# Patient Record
Sex: Male | Born: 1960 | Race: White | Hispanic: No | Marital: Married
Health system: Southern US, Community
[De-identification: ages and names within clinical notes are randomized; demographics above are authoritative.]

---

## 2003-01-04 ENCOUNTER — Encounter: Payer: Self-pay | Admitting: Internal Medicine

## 2003-01-04 ENCOUNTER — Encounter: Admission: RE | Admit: 2003-01-04 | Discharge: 2003-01-04 | Payer: Self-pay | Admitting: Internal Medicine

## 2005-11-03 ENCOUNTER — Other Ambulatory Visit: Admission: RE | Admit: 2005-11-03 | Discharge: 2005-11-03 | Payer: Self-pay | Admitting: Family Medicine

## 2005-12-06 ENCOUNTER — Ambulatory Visit (HOSPITAL_COMMUNITY): Admission: RE | Admit: 2005-12-06 | Discharge: 2005-12-07 | Payer: Self-pay | Admitting: Surgery

## 2011-10-05 ENCOUNTER — Other Ambulatory Visit: Payer: Self-pay | Admitting: Dermatology

## 2017-10-03 ENCOUNTER — Other Ambulatory Visit: Payer: Self-pay | Admitting: Physician Assistant

## 2017-10-03 DIAGNOSIS — R0989 Other specified symptoms and signs involving the circulatory and respiratory systems: Secondary | ICD-10-CM

## 2017-10-05 ENCOUNTER — Ambulatory Visit
Admission: RE | Admit: 2017-10-05 | Discharge: 2017-10-05 | Disposition: A | Discharge status: Home / Self Care | Payer: BLUE CROSS/BLUE SHIELD | Source: Ambulatory Visit | Attending: Physician Assistant | Admitting: Physician Assistant

## 2017-10-05 DIAGNOSIS — R0989 Other specified symptoms and signs involving the circulatory and respiratory systems: Secondary | ICD-10-CM

## 2018-03-22 DIAGNOSIS — M47896 Other spondylosis, lumbar region: Secondary | ICD-10-CM | POA: Diagnosis not present

## 2018-03-22 DIAGNOSIS — M160 Bilateral primary osteoarthritis of hip: Secondary | ICD-10-CM | POA: Diagnosis not present

## 2018-03-22 DIAGNOSIS — M5136 Other intervertebral disc degeneration, lumbar region: Secondary | ICD-10-CM | POA: Diagnosis not present

## 2018-03-22 DIAGNOSIS — I10 Essential (primary) hypertension: Secondary | ICD-10-CM | POA: Diagnosis not present

## 2018-03-23 DIAGNOSIS — E039 Hypothyroidism, unspecified: Secondary | ICD-10-CM | POA: Diagnosis not present

## 2018-03-23 DIAGNOSIS — I10 Essential (primary) hypertension: Secondary | ICD-10-CM | POA: Diagnosis not present

## 2018-03-23 DIAGNOSIS — E119 Type 2 diabetes mellitus without complications: Secondary | ICD-10-CM | POA: Diagnosis not present

## 2018-03-23 DIAGNOSIS — Z125 Encounter for screening for malignant neoplasm of prostate: Secondary | ICD-10-CM | POA: Diagnosis not present

## 2018-03-30 DIAGNOSIS — I10 Essential (primary) hypertension: Secondary | ICD-10-CM | POA: Diagnosis not present

## 2018-03-30 DIAGNOSIS — Z23 Encounter for immunization: Secondary | ICD-10-CM | POA: Diagnosis not present

## 2018-03-30 DIAGNOSIS — E119 Type 2 diabetes mellitus without complications: Secondary | ICD-10-CM | POA: Diagnosis not present

## 2018-03-30 DIAGNOSIS — E039 Hypothyroidism, unspecified: Secondary | ICD-10-CM | POA: Diagnosis not present

## 2018-04-05 DIAGNOSIS — F411 Generalized anxiety disorder: Secondary | ICD-10-CM | POA: Diagnosis not present

## 2018-04-27 DIAGNOSIS — M5416 Radiculopathy, lumbar region: Secondary | ICD-10-CM | POA: Diagnosis not present

## 2018-04-27 DIAGNOSIS — M545 Low back pain: Secondary | ICD-10-CM | POA: Diagnosis not present

## 2018-05-31 DIAGNOSIS — I10 Essential (primary) hypertension: Secondary | ICD-10-CM | POA: Diagnosis not present

## 2018-05-31 DIAGNOSIS — H6123 Impacted cerumen, bilateral: Secondary | ICD-10-CM | POA: Diagnosis not present

## 2018-05-31 DIAGNOSIS — E7800 Pure hypercholesterolemia, unspecified: Secondary | ICD-10-CM | POA: Diagnosis not present

## 2018-05-31 DIAGNOSIS — R7301 Impaired fasting glucose: Secondary | ICD-10-CM | POA: Diagnosis not present

## 2018-05-31 DIAGNOSIS — F329 Major depressive disorder, single episode, unspecified: Secondary | ICD-10-CM | POA: Diagnosis not present

## 2018-06-30 DIAGNOSIS — F4329 Adjustment disorder with other symptoms: Secondary | ICD-10-CM | POA: Diagnosis not present

## 2018-06-30 DIAGNOSIS — G479 Sleep disorder, unspecified: Secondary | ICD-10-CM | POA: Diagnosis not present

## 2018-06-30 DIAGNOSIS — F411 Generalized anxiety disorder: Secondary | ICD-10-CM | POA: Diagnosis not present

## 2018-07-06 DIAGNOSIS — M5136 Other intervertebral disc degeneration, lumbar region: Secondary | ICD-10-CM | POA: Diagnosis not present

## 2018-07-06 DIAGNOSIS — M47896 Other spondylosis, lumbar region: Secondary | ICD-10-CM | POA: Diagnosis not present

## 2018-07-06 DIAGNOSIS — M48061 Spinal stenosis, lumbar region without neurogenic claudication: Secondary | ICD-10-CM | POA: Diagnosis not present

## 2018-07-06 DIAGNOSIS — M5416 Radiculopathy, lumbar region: Secondary | ICD-10-CM | POA: Diagnosis not present

## 2018-08-07 DIAGNOSIS — M5416 Radiculopathy, lumbar region: Secondary | ICD-10-CM | POA: Diagnosis not present

## 2018-08-07 DIAGNOSIS — M47816 Spondylosis without myelopathy or radiculopathy, lumbar region: Secondary | ICD-10-CM | POA: Diagnosis not present

## 2018-08-07 DIAGNOSIS — M5137 Other intervertebral disc degeneration, lumbosacral region: Secondary | ICD-10-CM | POA: Diagnosis not present

## 2018-08-07 DIAGNOSIS — M5136 Other intervertebral disc degeneration, lumbar region: Secondary | ICD-10-CM | POA: Diagnosis not present

## 2018-09-25 DIAGNOSIS — F411 Generalized anxiety disorder: Secondary | ICD-10-CM | POA: Diagnosis not present

## 2018-09-25 DIAGNOSIS — F4329 Adjustment disorder with other symptoms: Secondary | ICD-10-CM | POA: Diagnosis not present

## 2018-10-18 DIAGNOSIS — R5383 Other fatigue: Secondary | ICD-10-CM | POA: Diagnosis not present

## 2018-10-18 DIAGNOSIS — E118 Type 2 diabetes mellitus with unspecified complications: Secondary | ICD-10-CM | POA: Diagnosis not present

## 2018-10-18 DIAGNOSIS — Z13220 Encounter for screening for lipoid disorders: Secondary | ICD-10-CM | POA: Diagnosis not present

## 2018-10-18 DIAGNOSIS — E039 Hypothyroidism, unspecified: Secondary | ICD-10-CM | POA: Diagnosis not present

## 2018-10-18 DIAGNOSIS — I10 Essential (primary) hypertension: Secondary | ICD-10-CM | POA: Diagnosis not present

## 2018-10-23 DIAGNOSIS — E119 Type 2 diabetes mellitus without complications: Secondary | ICD-10-CM | POA: Diagnosis not present

## 2018-10-23 DIAGNOSIS — I10 Essential (primary) hypertension: Secondary | ICD-10-CM | POA: Diagnosis not present

## 2018-10-23 DIAGNOSIS — E039 Hypothyroidism, unspecified: Secondary | ICD-10-CM | POA: Diagnosis not present

## 2018-10-23 DIAGNOSIS — Z23 Encounter for immunization: Secondary | ICD-10-CM | POA: Diagnosis not present

## 2018-11-06 DIAGNOSIS — M1612 Unilateral primary osteoarthritis, left hip: Secondary | ICD-10-CM | POA: Diagnosis not present

## 2018-11-06 DIAGNOSIS — M5136 Other intervertebral disc degeneration, lumbar region: Secondary | ICD-10-CM | POA: Diagnosis not present

## 2018-11-06 DIAGNOSIS — M5416 Radiculopathy, lumbar region: Secondary | ICD-10-CM | POA: Diagnosis not present

## 2018-11-06 DIAGNOSIS — M25552 Pain in left hip: Secondary | ICD-10-CM | POA: Diagnosis not present

## 2018-12-21 DIAGNOSIS — F4329 Adjustment disorder with other symptoms: Secondary | ICD-10-CM | POA: Diagnosis not present

## 2018-12-21 DIAGNOSIS — F411 Generalized anxiety disorder: Secondary | ICD-10-CM | POA: Diagnosis not present

## 2019-01-03 DIAGNOSIS — M5137 Other intervertebral disc degeneration, lumbosacral region: Secondary | ICD-10-CM | POA: Diagnosis not present

## 2019-01-03 DIAGNOSIS — M47896 Other spondylosis, lumbar region: Secondary | ICD-10-CM | POA: Diagnosis not present

## 2019-01-03 DIAGNOSIS — M5416 Radiculopathy, lumbar region: Secondary | ICD-10-CM | POA: Diagnosis not present

## 2019-01-03 DIAGNOSIS — E119 Type 2 diabetes mellitus without complications: Secondary | ICD-10-CM | POA: Diagnosis not present

## 2019-03-21 DIAGNOSIS — F4329 Adjustment disorder with other symptoms: Secondary | ICD-10-CM | POA: Diagnosis not present

## 2019-03-21 DIAGNOSIS — F411 Generalized anxiety disorder: Secondary | ICD-10-CM | POA: Diagnosis not present

## 2019-04-05 DIAGNOSIS — Z79891 Long term (current) use of opiate analgesic: Secondary | ICD-10-CM | POA: Diagnosis not present

## 2019-04-05 DIAGNOSIS — M4726 Other spondylosis with radiculopathy, lumbar region: Secondary | ICD-10-CM | POA: Diagnosis not present

## 2019-04-05 DIAGNOSIS — M5116 Intervertebral disc disorders with radiculopathy, lumbar region: Secondary | ICD-10-CM | POA: Diagnosis not present

## 2019-04-05 DIAGNOSIS — M48061 Spinal stenosis, lumbar region without neurogenic claudication: Secondary | ICD-10-CM | POA: Diagnosis not present

## 2019-05-02 DIAGNOSIS — M5116 Intervertebral disc disorders with radiculopathy, lumbar region: Secondary | ICD-10-CM | POA: Diagnosis not present

## 2019-05-02 DIAGNOSIS — M4726 Other spondylosis with radiculopathy, lumbar region: Secondary | ICD-10-CM | POA: Diagnosis not present

## 2019-05-02 DIAGNOSIS — M48061 Spinal stenosis, lumbar region without neurogenic claudication: Secondary | ICD-10-CM | POA: Diagnosis not present

## 2019-05-02 DIAGNOSIS — I10 Essential (primary) hypertension: Secondary | ICD-10-CM | POA: Diagnosis not present

## 2019-05-10 DIAGNOSIS — R7303 Prediabetes: Secondary | ICD-10-CM | POA: Diagnosis not present

## 2019-05-10 DIAGNOSIS — E039 Hypothyroidism, unspecified: Secondary | ICD-10-CM | POA: Diagnosis not present

## 2019-06-14 DIAGNOSIS — I10 Essential (primary) hypertension: Secondary | ICD-10-CM | POA: Diagnosis not present

## 2019-06-14 DIAGNOSIS — N289 Disorder of kidney and ureter, unspecified: Secondary | ICD-10-CM | POA: Diagnosis not present

## 2019-06-14 DIAGNOSIS — Z23 Encounter for immunization: Secondary | ICD-10-CM | POA: Diagnosis not present

## 2019-06-14 DIAGNOSIS — E119 Type 2 diabetes mellitus without complications: Secondary | ICD-10-CM | POA: Diagnosis not present

## 2019-06-14 DIAGNOSIS — E039 Hypothyroidism, unspecified: Secondary | ICD-10-CM | POA: Diagnosis not present

## 2019-06-18 DIAGNOSIS — F411 Generalized anxiety disorder: Secondary | ICD-10-CM | POA: Diagnosis not present

## 2019-06-18 DIAGNOSIS — F4329 Adjustment disorder with other symptoms: Secondary | ICD-10-CM | POA: Diagnosis not present

## 2019-08-09 DIAGNOSIS — M5136 Other intervertebral disc degeneration, lumbar region: Secondary | ICD-10-CM | POA: Diagnosis not present

## 2019-08-09 DIAGNOSIS — M5416 Radiculopathy, lumbar region: Secondary | ICD-10-CM | POA: Diagnosis not present

## 2019-08-09 DIAGNOSIS — M5126 Other intervertebral disc displacement, lumbar region: Secondary | ICD-10-CM | POA: Diagnosis not present

## 2019-08-13 DIAGNOSIS — F329 Major depressive disorder, single episode, unspecified: Secondary | ICD-10-CM | POA: Diagnosis not present

## 2019-08-13 DIAGNOSIS — J309 Allergic rhinitis, unspecified: Secondary | ICD-10-CM | POA: Diagnosis not present

## 2019-08-13 DIAGNOSIS — I10 Essential (primary) hypertension: Secondary | ICD-10-CM | POA: Diagnosis not present

## 2019-08-13 DIAGNOSIS — E7800 Pure hypercholesterolemia, unspecified: Secondary | ICD-10-CM | POA: Diagnosis not present

## 2019-08-15 DIAGNOSIS — M545 Low back pain: Secondary | ICD-10-CM | POA: Diagnosis not present

## 2019-08-15 DIAGNOSIS — M5136 Other intervertebral disc degeneration, lumbar region: Secondary | ICD-10-CM | POA: Diagnosis not present

## 2019-08-15 DIAGNOSIS — M5416 Radiculopathy, lumbar region: Secondary | ICD-10-CM | POA: Diagnosis not present

## 2019-08-20 DIAGNOSIS — F4329 Adjustment disorder with other symptoms: Secondary | ICD-10-CM | POA: Diagnosis not present

## 2019-08-20 DIAGNOSIS — G479 Sleep disorder, unspecified: Secondary | ICD-10-CM | POA: Diagnosis not present

## 2019-08-20 DIAGNOSIS — F411 Generalized anxiety disorder: Secondary | ICD-10-CM | POA: Diagnosis not present

## 2019-09-11 DIAGNOSIS — F419 Anxiety disorder, unspecified: Secondary | ICD-10-CM | POA: Diagnosis not present

## 2019-09-11 DIAGNOSIS — F4329 Adjustment disorder with other symptoms: Secondary | ICD-10-CM | POA: Diagnosis not present

## 2019-09-13 DIAGNOSIS — M1612 Unilateral primary osteoarthritis, left hip: Secondary | ICD-10-CM | POA: Diagnosis not present

## 2019-09-13 DIAGNOSIS — M5126 Other intervertebral disc displacement, lumbar region: Secondary | ICD-10-CM | POA: Diagnosis not present

## 2019-09-13 DIAGNOSIS — M5137 Other intervertebral disc degeneration, lumbosacral region: Secondary | ICD-10-CM | POA: Diagnosis not present

## 2019-10-04 DIAGNOSIS — M1612 Unilateral primary osteoarthritis, left hip: Secondary | ICD-10-CM | POA: Diagnosis not present

## 2019-10-06 DIAGNOSIS — M1612 Unilateral primary osteoarthritis, left hip: Secondary | ICD-10-CM | POA: Diagnosis not present

## 2019-10-09 DIAGNOSIS — Z20828 Contact with and (suspected) exposure to other viral communicable diseases: Secondary | ICD-10-CM | POA: Diagnosis not present

## 2019-10-15 DIAGNOSIS — M47816 Spondylosis without myelopathy or radiculopathy, lumbar region: Secondary | ICD-10-CM | POA: Diagnosis not present

## 2019-10-15 DIAGNOSIS — M5136 Other intervertebral disc degeneration, lumbar region: Secondary | ICD-10-CM | POA: Diagnosis not present

## 2019-10-15 DIAGNOSIS — M1612 Unilateral primary osteoarthritis, left hip: Secondary | ICD-10-CM | POA: Diagnosis not present

## 2019-10-15 DIAGNOSIS — M25552 Pain in left hip: Secondary | ICD-10-CM | POA: Diagnosis not present

## 2019-10-16 DIAGNOSIS — Z79899 Other long term (current) drug therapy: Secondary | ICD-10-CM | POA: Diagnosis not present

## 2019-10-16 DIAGNOSIS — F419 Anxiety disorder, unspecified: Secondary | ICD-10-CM | POA: Diagnosis not present

## 2019-10-16 DIAGNOSIS — E7800 Pure hypercholesterolemia, unspecified: Secondary | ICD-10-CM | POA: Diagnosis not present

## 2019-10-16 DIAGNOSIS — F4329 Adjustment disorder with other symptoms: Secondary | ICD-10-CM | POA: Diagnosis not present

## 2019-10-16 DIAGNOSIS — Z0000 Encounter for general adult medical examination without abnormal findings: Secondary | ICD-10-CM | POA: Diagnosis not present

## 2019-10-19 DIAGNOSIS — Z23 Encounter for immunization: Secondary | ICD-10-CM | POA: Diagnosis not present

## 2019-10-19 DIAGNOSIS — H6123 Impacted cerumen, bilateral: Secondary | ICD-10-CM | POA: Diagnosis not present

## 2019-10-24 DIAGNOSIS — Z20828 Contact with and (suspected) exposure to other viral communicable diseases: Secondary | ICD-10-CM | POA: Diagnosis not present

## 2019-11-16 DIAGNOSIS — T300 Burn of unspecified body region, unspecified degree: Secondary | ICD-10-CM | POA: Diagnosis not present

## 2019-11-16 DIAGNOSIS — E119 Type 2 diabetes mellitus without complications: Secondary | ICD-10-CM | POA: Diagnosis not present

## 2019-11-16 DIAGNOSIS — I10 Essential (primary) hypertension: Secondary | ICD-10-CM | POA: Diagnosis not present

## 2019-11-16 DIAGNOSIS — Z125 Encounter for screening for malignant neoplasm of prostate: Secondary | ICD-10-CM | POA: Diagnosis not present

## 2019-11-21 DIAGNOSIS — M1612 Unilateral primary osteoarthritis, left hip: Secondary | ICD-10-CM | POA: Diagnosis not present

## 2019-11-21 DIAGNOSIS — M47816 Spondylosis without myelopathy or radiculopathy, lumbar region: Secondary | ICD-10-CM | POA: Diagnosis not present

## 2019-11-21 DIAGNOSIS — M5136 Other intervertebral disc degeneration, lumbar region: Secondary | ICD-10-CM | POA: Diagnosis not present

## 2019-11-21 DIAGNOSIS — E119 Type 2 diabetes mellitus without complications: Secondary | ICD-10-CM | POA: Diagnosis not present

## 2019-11-28 DIAGNOSIS — F411 Generalized anxiety disorder: Secondary | ICD-10-CM | POA: Diagnosis not present

## 2019-11-28 DIAGNOSIS — F4329 Adjustment disorder with other symptoms: Secondary | ICD-10-CM | POA: Diagnosis not present

## 2019-12-24 DIAGNOSIS — I10 Essential (primary) hypertension: Secondary | ICD-10-CM | POA: Diagnosis not present

## 2019-12-24 DIAGNOSIS — E039 Hypothyroidism, unspecified: Secondary | ICD-10-CM | POA: Diagnosis not present

## 2019-12-24 DIAGNOSIS — E119 Type 2 diabetes mellitus without complications: Secondary | ICD-10-CM | POA: Diagnosis not present

## 2019-12-24 DIAGNOSIS — B351 Tinea unguium: Secondary | ICD-10-CM | POA: Diagnosis not present

## 2020-01-03 DIAGNOSIS — M79674 Pain in right toe(s): Secondary | ICD-10-CM | POA: Diagnosis not present

## 2020-01-03 DIAGNOSIS — B351 Tinea unguium: Secondary | ICD-10-CM | POA: Diagnosis not present

## 2020-01-07 DIAGNOSIS — N289 Disorder of kidney and ureter, unspecified: Secondary | ICD-10-CM | POA: Diagnosis not present

## 2020-01-07 DIAGNOSIS — E119 Type 2 diabetes mellitus without complications: Secondary | ICD-10-CM | POA: Diagnosis not present

## 2020-01-09 DIAGNOSIS — Z23 Encounter for immunization: Secondary | ICD-10-CM | POA: Diagnosis not present

## 2020-01-24 ENCOUNTER — Ambulatory Visit: Payer: BC Managed Care – PPO | Attending: Internal Medicine

## 2020-01-24 DIAGNOSIS — Z23 Encounter for immunization: Secondary | ICD-10-CM

## 2020-01-24 NOTE — Progress Notes (Signed)
   Covid-19 Vaccination Clinic  Name:  Steven Lutz    MRN: [id number] DOB: [birth year]  01/24/2020  Mr. Swim was observed post Covid-19 immunization for 15 minutes without incident. He was provided with Vaccine Information Sheet and instruction to access the V-Safe system.   Mr. Wotton was instructed to call 911 with any severe reactions post vaccine: Marland Kitchen Difficulty breathing  . Swelling of face and throat  . A fast heartbeat  . A bad rash all over body  . Dizziness and weakness   Immunizations Administered    Name Date Dose VIS Date Route   Pfizer COVID-19 Vaccine 01/24/2020  9:42 AM 0.3 mL 10/12/2019 Intramuscular   Manufacturer: [employer], [employer]   Lot: [id number]   NDC: [address]-8

## 2020-02-18 ENCOUNTER — Ambulatory Visit: Payer: BC Managed Care – PPO | Attending: Internal Medicine

## 2020-02-18 DIAGNOSIS — Z23 Encounter for immunization: Secondary | ICD-10-CM

## 2020-02-18 NOTE — Progress Notes (Signed)
   Covid-19 Vaccination Clinic  Name:  Steven Lutz    MRN: [id number] DOB: 1960/11/06  02/18/2020  [name] was observed post Covid-19 immunization for 15 minutes without incident. He was provided with Vaccine Information Sheet and instruction to access the V-Safe system.   [name] was instructed to call 911 with any severe reactions post vaccine: Marland Kitchen Difficulty breathing  . Swelling of face and throat  . A fast heartbeat  . A bad rash all over body  . Dizziness and weakness   Immunizations Administered    Name Date Dose VIS Date Route   Pfizer COVID-19 Vaccine 02/18/2020  9:39 AM 0.3 mL 12/26/2018 Intramuscular   Manufacturer: [employer], [employer]   Lot: [id number]   NDC: [address]-7

## 2020-03-14 DIAGNOSIS — R972 Elevated prostate specific antigen [PSA]: Secondary | ICD-10-CM | POA: Diagnosis not present

## 2020-05-09 DIAGNOSIS — N401 Enlarged prostate with lower urinary tract symptoms: Secondary | ICD-10-CM | POA: Diagnosis not present

## 2020-05-09 DIAGNOSIS — R972 Elevated prostate specific antigen [PSA]: Secondary | ICD-10-CM | POA: Diagnosis not present

## 2020-05-09 DIAGNOSIS — R351 Nocturia: Secondary | ICD-10-CM | POA: Diagnosis not present

## 2020-05-14 DIAGNOSIS — H6983 Other specified disorders of Eustachian tube, bilateral: Secondary | ICD-10-CM | POA: Diagnosis not present

## 2020-05-14 DIAGNOSIS — H8113 Benign paroxysmal vertigo, bilateral: Secondary | ICD-10-CM | POA: Diagnosis not present

## 2020-07-23 ENCOUNTER — Other Ambulatory Visit: Payer: Self-pay | Admitting: Urology

## 2020-07-23 DIAGNOSIS — R972 Elevated prostate specific antigen [PSA]: Secondary | ICD-10-CM

## 2020-10-02 ENCOUNTER — Ambulatory Visit
Admission: RE | Admit: 2020-10-02 | Discharge: 2020-10-02 | Disposition: A | Discharge status: Home / Self Care | Payer: BC Managed Care – PPO | Source: Ambulatory Visit | Attending: Urology | Admitting: Urology

## 2020-10-02 ENCOUNTER — Other Ambulatory Visit: Payer: Self-pay

## 2020-10-02 DIAGNOSIS — R972 Elevated prostate specific antigen [PSA]: Secondary | ICD-10-CM

## 2020-10-02 MED ORDER — GADOBENATE DIMEGLUMINE 529 MG/ML IV SOLN
14.0000 mL | Freq: Once | INTRAVENOUS | Status: AC | PRN
  Administered 2020-10-02: 14 mL via INTRAVENOUS

## 2020-10-29 DIAGNOSIS — E7800 Pure hypercholesterolemia, unspecified: Secondary | ICD-10-CM | POA: Diagnosis not present

## 2020-10-29 DIAGNOSIS — Z0000 Encounter for general adult medical examination without abnormal findings: Secondary | ICD-10-CM | POA: Diagnosis not present

## 2021-02-19 IMAGING — MR MR PROSTATE WO/W CM
12 series · 48 of 48 positions shown · IV contrast (16 ML MULTIHANCE)
Comparison: None.

CLINICAL DATA: Elevated PSA.

EXAM:
MR PROSTATE WITHOUT AND WITH CONTRAST
TECHNIQUE: Multiplanar multisequence MRI images were obtained of the pelvis
centered about the prostate. Pre and post contrast images were
obtained.
CONTRAST:  14mL MULTIHANCE GADOBENATE DIMEGLUMINE 529 MG/ML IV SOLN

[Series 3: T2 · coronal · 3.0mm · 0.56mm/px · 1 of 25 slices shown (1 of 3)]
[im 1/25]
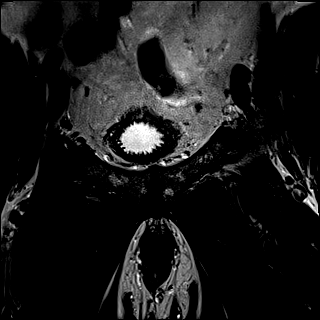

[Series 4: T1 · axial · 5.0mm · 1.25mm/px · 1 of 80 slices shown]
[im 1/80]
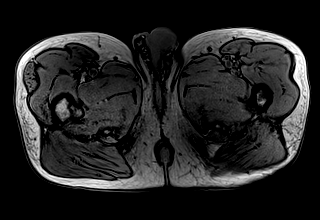

[Series 5: DWI · axial · 3.0mm · 1.75mm/px · z∈[-55,+8]mm · 2 of 66 slices shown (1 of 3)]
[im 1/66]
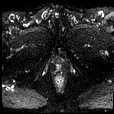
[im 66/66]
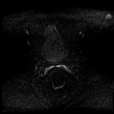

[Series 6: DWI · axial · 3.0mm · 1.75mm/px · 1 of 22 slices shown (2 of 3)]
[im 1/22]
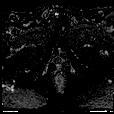

[Series 7: DWI · axial · 3.0mm · 1.75mm/px · 1 of 22 slices shown (3 of 3)]
[im 1/22]
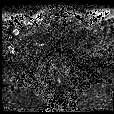

[Series 8: T2 · axial · 3.0mm · 0.56mm/px · 1 of 25 slices shown (2 of 3)]
[im 1/25]
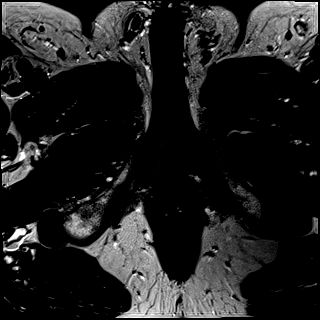

[Series 9: T2 · axial · 1.0mm · 1.04mm/px · z∈[-67,+12]mm · 2 of 80 slices shown (3 of 3)]
[im 1/80]
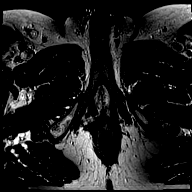
[im 80/80]
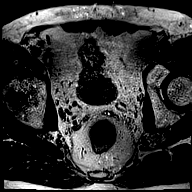

[Series 10: pre t1_twist_tra_dyn · axial · non-contrast · 3.5mm · 0.83mm/px · 1 of 20 slices shown]
[im 1/20]
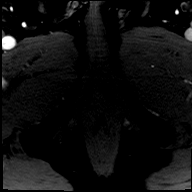

[Series 11: post t1_twist_tra_dyn-copy center · axial · non-contrast · 3.5mm · 0.83mm/px · z∈[-58,+8]mm · 17 of 600 slices shown]
[im 1/600]
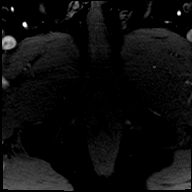
[im 38/600]
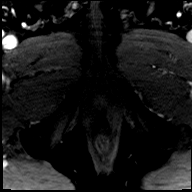
[im 75/600]
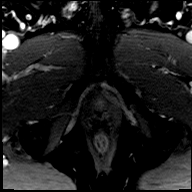
[im 113/600]
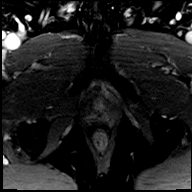
[im 150/600]
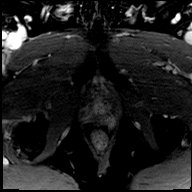
[im 188/600]
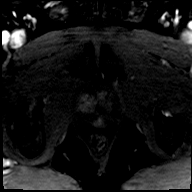
[im 225/600]
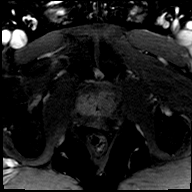
[im 263/600]
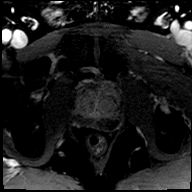
[im 300/600]
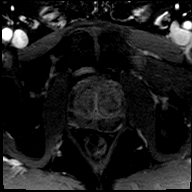
[im 337/600]
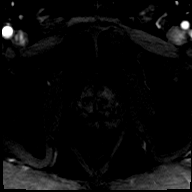
[im 375/600]
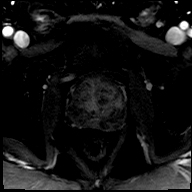
[im 412/600]
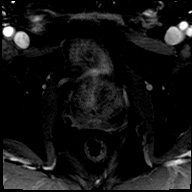
[im 450/600]
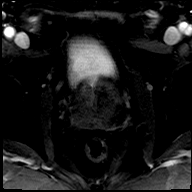
[im 487/600]
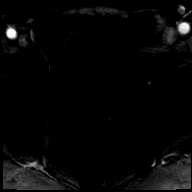
[im 525/600]
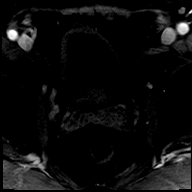
[im 562/600]
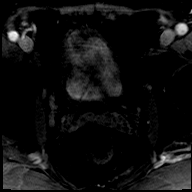
[im 600/600]
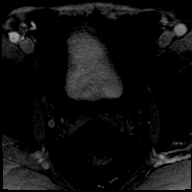

[Series 12: post t1_twist_tra_dyn-copy cent_sub · axial · 3.5mm · 0.83mm/px · z∈[-58,+8]mm · 17 of 580 slices shown]
[im 1/580]
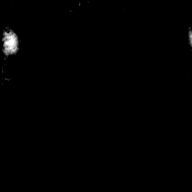
[im 37/580]
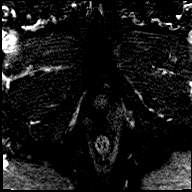
[im 73/580]
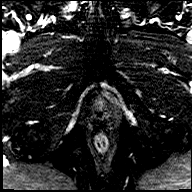
[im 109/580]
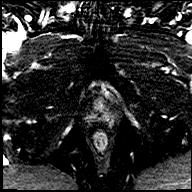
[im 145/580]
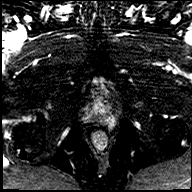
[im 181/580]
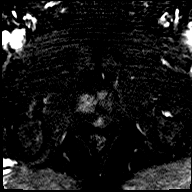
[im 218/580]
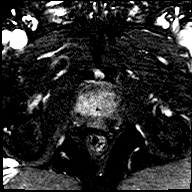
[im 254/580]
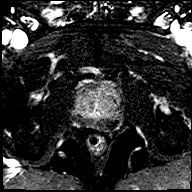
[im 290/580]
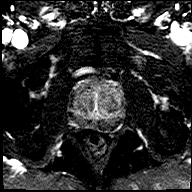
[im 326/580]
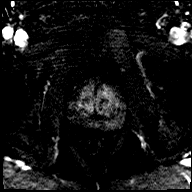
[im 362/580]
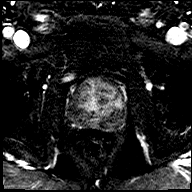
[im 399/580]
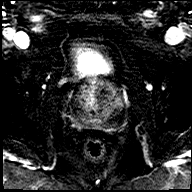
[im 435/580]
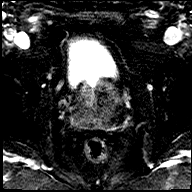
[im 471/580]
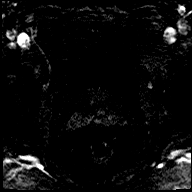
[im 507/580]
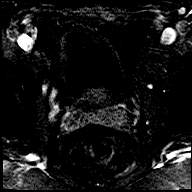
[im 543/580]
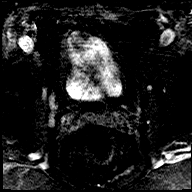
[im 580/580]
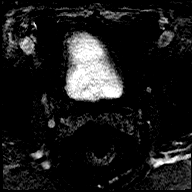

[Series 13: t1_vibe_dixon_tra_f · axial · 2.5mm · 0.91mm/px · z∈[-88,+110]mm · 2 of 80 slices shown]
[im 1/80]
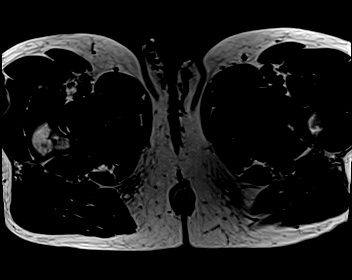
[im 80/80]
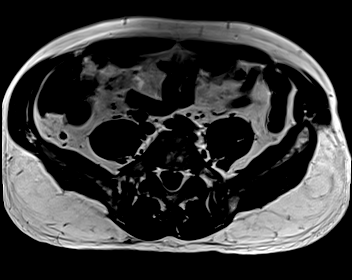

[Series 14: t1_vibe_dixon_tra_w · axial · 2.5mm · 0.91mm/px · z∈[-88,+110]mm · 2 of 80 slices shown]
[im 1/80]
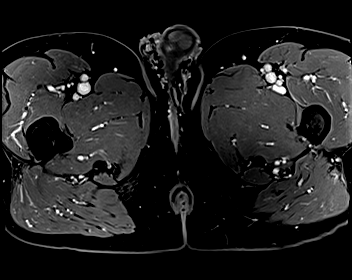
[im 80/80]
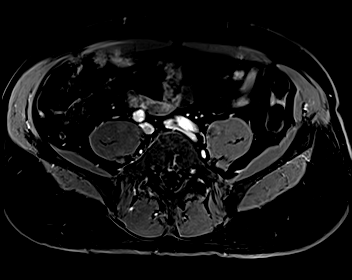

[48 of 48 positions shown; findings below may reference images not displayed]

FINDINGS: Prostate:

-- Peripheral Zone: Linear/wedge shaped hypointensities are noted on
ADC; however, no focal ADC hypointense or high b-value DWI
hyperintense nodules are identified.

-- Transition/Central Zone: Circumscribed BPH nodules are noted, but
no suspicious nodules with obscured or non-circumscribed margins
seen.

-- Measurements/Volume:  4.8 x 4.1 x 4.8 cm (volume = 49 cm^3)

Transcapsular spread:  Absent

Seminal vesicle involvement:  Absent

Neurovascular bundle involvement:  Absent

Pelvic adenopathy: None visualized

Bone metastasis: None visualized

Other:  None
IMPRESSION: No radiographic evidence of high-grade prostate carcinoma. PI-RADS
2: Low (clinically significant cancer is unlikely to be present)

## 2021-11-19 DIAGNOSIS — E7800 Pure hypercholesterolemia, unspecified: Secondary | ICD-10-CM | POA: Diagnosis not present

## 2021-11-19 DIAGNOSIS — Z9009 Acquired absence of other part of head and neck: Secondary | ICD-10-CM | POA: Diagnosis not present

## 2021-11-19 DIAGNOSIS — N401 Enlarged prostate with lower urinary tract symptoms: Secondary | ICD-10-CM | POA: Diagnosis not present

## 2021-11-19 DIAGNOSIS — Z0000 Encounter for general adult medical examination without abnormal findings: Secondary | ICD-10-CM | POA: Diagnosis not present

## 2021-11-19 DIAGNOSIS — Z79899 Other long term (current) drug therapy: Secondary | ICD-10-CM | POA: Diagnosis not present

## 2022-07-28 DIAGNOSIS — M25469 Effusion, unspecified knee: Secondary | ICD-10-CM | POA: Diagnosis not present

## 2022-08-16 DIAGNOSIS — M25561 Pain in right knee: Secondary | ICD-10-CM | POA: Diagnosis not present

## 2022-09-21 DIAGNOSIS — Z23 Encounter for immunization: Secondary | ICD-10-CM | POA: Diagnosis not present

## 2022-09-28 DIAGNOSIS — M25561 Pain in right knee: Secondary | ICD-10-CM | POA: Diagnosis not present

## 2022-11-05 ENCOUNTER — Other Ambulatory Visit (HOSPITAL_COMMUNITY): Payer: Self-pay | Admitting: Family Medicine

## 2022-11-05 ENCOUNTER — Ambulatory Visit (HOSPITAL_BASED_OUTPATIENT_CLINIC_OR_DEPARTMENT_OTHER)
Admission: RE | Admit: 2022-11-05 | Discharge: 2022-11-05 | Disposition: A | Discharge status: Home / Self Care | Payer: BC Managed Care – PPO | Source: Ambulatory Visit | Attending: Family Medicine | Admitting: Family Medicine

## 2022-11-05 DIAGNOSIS — M79604 Pain in right leg: Secondary | ICD-10-CM | POA: Diagnosis not present

## 2022-11-05 DIAGNOSIS — M7989 Other specified soft tissue disorders: Secondary | ICD-10-CM | POA: Diagnosis not present

## 2022-11-05 DIAGNOSIS — R600 Localized edema: Secondary | ICD-10-CM | POA: Insufficient documentation

## 2022-12-06 DIAGNOSIS — Z96642 Presence of left artificial hip joint: Secondary | ICD-10-CM | POA: Diagnosis not present

## 2022-12-15 DIAGNOSIS — F17210 Nicotine dependence, cigarettes, uncomplicated: Secondary | ICD-10-CM | POA: Diagnosis not present

## 2022-12-15 DIAGNOSIS — J449 Chronic obstructive pulmonary disease, unspecified: Secondary | ICD-10-CM | POA: Diagnosis not present

## 2023-02-22 DIAGNOSIS — F329 Major depressive disorder, single episode, unspecified: Secondary | ICD-10-CM | POA: Diagnosis not present

## 2023-02-22 DIAGNOSIS — Y92830 Public park as the place of occurrence of the external cause: Secondary | ICD-10-CM | POA: Diagnosis not present

## 2023-02-22 DIAGNOSIS — R45851 Suicidal ideations: Secondary | ICD-10-CM | POA: Diagnosis not present

## 2023-02-22 DIAGNOSIS — Z23 Encounter for immunization: Secondary | ICD-10-CM | POA: Diagnosis not present

## 2023-02-22 DIAGNOSIS — E119 Type 2 diabetes mellitus without complications: Secondary | ICD-10-CM | POA: Diagnosis not present

## 2023-02-22 DIAGNOSIS — Z87891 Personal history of nicotine dependence: Secondary | ICD-10-CM | POA: Diagnosis not present

## 2023-02-22 DIAGNOSIS — X781XXA Intentional self-harm by knife, initial encounter: Secondary | ICD-10-CM | POA: Diagnosis not present

## 2023-02-22 DIAGNOSIS — S61512A Laceration without foreign body of left wrist, initial encounter: Secondary | ICD-10-CM | POA: Diagnosis not present

## 2023-02-23 DIAGNOSIS — F329 Major depressive disorder, single episode, unspecified: Secondary | ICD-10-CM | POA: Diagnosis not present

## 2023-03-17 DIAGNOSIS — Z9009 Acquired absence of other part of head and neck: Secondary | ICD-10-CM | POA: Diagnosis not present

## 2023-03-17 DIAGNOSIS — N401 Enlarged prostate with lower urinary tract symptoms: Secondary | ICD-10-CM | POA: Diagnosis not present

## 2023-03-17 DIAGNOSIS — E7800 Pure hypercholesterolemia, unspecified: Secondary | ICD-10-CM | POA: Diagnosis not present

## 2023-03-17 DIAGNOSIS — F411 Generalized anxiety disorder: Secondary | ICD-10-CM | POA: Diagnosis not present

## 2023-05-27 DIAGNOSIS — H43391 Other vitreous opacities, right eye: Secondary | ICD-10-CM | POA: Diagnosis not present

## 2023-05-27 DIAGNOSIS — H52221 Regular astigmatism, right eye: Secondary | ICD-10-CM | POA: Diagnosis not present

## 2023-05-27 DIAGNOSIS — H5213 Myopia, bilateral: Secondary | ICD-10-CM | POA: Diagnosis not present

## 2023-05-27 DIAGNOSIS — H524 Presbyopia: Secondary | ICD-10-CM | POA: Diagnosis not present

## 2023-05-27 DIAGNOSIS — H35373 Puckering of macula, bilateral: Secondary | ICD-10-CM | POA: Diagnosis not present

## 2023-06-21 DIAGNOSIS — F331 Major depressive disorder, recurrent, moderate: Secondary | ICD-10-CM | POA: Diagnosis not present

## 2023-07-05 DIAGNOSIS — F331 Major depressive disorder, recurrent, moderate: Secondary | ICD-10-CM | POA: Diagnosis not present

## 2023-07-19 DIAGNOSIS — F331 Major depressive disorder, recurrent, moderate: Secondary | ICD-10-CM | POA: Diagnosis not present

## 2023-08-02 DIAGNOSIS — E7800 Pure hypercholesterolemia, unspecified: Secondary | ICD-10-CM | POA: Diagnosis not present

## 2023-08-02 DIAGNOSIS — F329 Major depressive disorder, single episode, unspecified: Secondary | ICD-10-CM | POA: Diagnosis not present

## 2023-08-02 DIAGNOSIS — F411 Generalized anxiety disorder: Secondary | ICD-10-CM | POA: Diagnosis not present

## 2023-08-02 DIAGNOSIS — F331 Major depressive disorder, recurrent, moderate: Secondary | ICD-10-CM | POA: Diagnosis not present

## 2023-08-16 DIAGNOSIS — F331 Major depressive disorder, recurrent, moderate: Secondary | ICD-10-CM | POA: Diagnosis not present

## 2023-08-30 DIAGNOSIS — F331 Major depressive disorder, recurrent, moderate: Secondary | ICD-10-CM | POA: Diagnosis not present

## 2023-09-06 DIAGNOSIS — R351 Nocturia: Secondary | ICD-10-CM | POA: Diagnosis not present

## 2023-09-06 DIAGNOSIS — R972 Elevated prostate specific antigen [PSA]: Secondary | ICD-10-CM | POA: Diagnosis not present

## 2023-09-06 DIAGNOSIS — N401 Enlarged prostate with lower urinary tract symptoms: Secondary | ICD-10-CM | POA: Diagnosis not present

## 2023-09-13 DIAGNOSIS — F331 Major depressive disorder, recurrent, moderate: Secondary | ICD-10-CM | POA: Diagnosis not present

## 2023-09-27 DIAGNOSIS — F331 Major depressive disorder, recurrent, moderate: Secondary | ICD-10-CM | POA: Diagnosis not present

## 2023-10-11 DIAGNOSIS — F331 Major depressive disorder, recurrent, moderate: Secondary | ICD-10-CM | POA: Diagnosis not present

## 2023-10-19 DIAGNOSIS — E7800 Pure hypercholesterolemia, unspecified: Secondary | ICD-10-CM | POA: Diagnosis not present

## 2023-10-19 DIAGNOSIS — Z0000 Encounter for general adult medical examination without abnormal findings: Secondary | ICD-10-CM | POA: Diagnosis not present

## 2023-10-19 DIAGNOSIS — Z79899 Other long term (current) drug therapy: Secondary | ICD-10-CM | POA: Diagnosis not present

## 2023-10-19 DIAGNOSIS — Z9009 Acquired absence of other part of head and neck: Secondary | ICD-10-CM | POA: Diagnosis not present

## 2023-11-01 DIAGNOSIS — F331 Major depressive disorder, recurrent, moderate: Secondary | ICD-10-CM | POA: Diagnosis not present

## 2023-11-15 DIAGNOSIS — F331 Major depressive disorder, recurrent, moderate: Secondary | ICD-10-CM | POA: Diagnosis not present

## 2023-11-29 DIAGNOSIS — N401 Enlarged prostate with lower urinary tract symptoms: Secondary | ICD-10-CM | POA: Diagnosis not present

## 2023-11-29 DIAGNOSIS — R351 Nocturia: Secondary | ICD-10-CM | POA: Diagnosis not present

## 2023-11-29 DIAGNOSIS — F331 Major depressive disorder, recurrent, moderate: Secondary | ICD-10-CM | POA: Diagnosis not present

## 2023-12-06 DIAGNOSIS — R351 Nocturia: Secondary | ICD-10-CM | POA: Diagnosis not present

## 2023-12-06 DIAGNOSIS — R972 Elevated prostate specific antigen [PSA]: Secondary | ICD-10-CM | POA: Diagnosis not present

## 2023-12-06 DIAGNOSIS — N401 Enlarged prostate with lower urinary tract symptoms: Secondary | ICD-10-CM | POA: Diagnosis not present

## 2023-12-08 DIAGNOSIS — F5101 Primary insomnia: Secondary | ICD-10-CM | POA: Diagnosis not present

## 2023-12-08 DIAGNOSIS — F330 Major depressive disorder, recurrent, mild: Secondary | ICD-10-CM | POA: Diagnosis not present

## 2023-12-08 DIAGNOSIS — F411 Generalized anxiety disorder: Secondary | ICD-10-CM | POA: Diagnosis not present

## 2023-12-08 DIAGNOSIS — R413 Other amnesia: Secondary | ICD-10-CM | POA: Diagnosis not present

## 2023-12-08 DIAGNOSIS — R41840 Attention and concentration deficit: Secondary | ICD-10-CM | POA: Diagnosis not present

## 2023-12-08 DIAGNOSIS — F902 Attention-deficit hyperactivity disorder, combined type: Secondary | ICD-10-CM | POA: Diagnosis not present

## 2023-12-21 DIAGNOSIS — F5101 Primary insomnia: Secondary | ICD-10-CM | POA: Diagnosis not present

## 2023-12-21 DIAGNOSIS — F902 Attention-deficit hyperactivity disorder, combined type: Secondary | ICD-10-CM | POA: Diagnosis not present

## 2023-12-27 DIAGNOSIS — F331 Major depressive disorder, recurrent, moderate: Secondary | ICD-10-CM | POA: Diagnosis not present

## 2024-01-10 DIAGNOSIS — F331 Major depressive disorder, recurrent, moderate: Secondary | ICD-10-CM | POA: Diagnosis not present

## 2024-01-31 DIAGNOSIS — F331 Major depressive disorder, recurrent, moderate: Secondary | ICD-10-CM | POA: Diagnosis not present

## 2024-02-20 DIAGNOSIS — F331 Major depressive disorder, recurrent, moderate: Secondary | ICD-10-CM | POA: Diagnosis not present

## 2024-03-06 DIAGNOSIS — F331 Major depressive disorder, recurrent, moderate: Secondary | ICD-10-CM | POA: Diagnosis not present

## 2024-03-15 DIAGNOSIS — F411 Generalized anxiety disorder: Secondary | ICD-10-CM | POA: Diagnosis not present

## 2024-03-15 DIAGNOSIS — R413 Other amnesia: Secondary | ICD-10-CM | POA: Diagnosis not present

## 2024-03-15 DIAGNOSIS — F330 Major depressive disorder, recurrent, mild: Secondary | ICD-10-CM | POA: Diagnosis not present

## 2024-03-15 DIAGNOSIS — F5101 Primary insomnia: Secondary | ICD-10-CM | POA: Diagnosis not present

## 2024-04-19 DIAGNOSIS — R41840 Attention and concentration deficit: Secondary | ICD-10-CM | POA: Diagnosis not present

## 2024-04-19 DIAGNOSIS — F5101 Primary insomnia: Secondary | ICD-10-CM | POA: Diagnosis not present

## 2024-04-19 DIAGNOSIS — R413 Other amnesia: Secondary | ICD-10-CM | POA: Diagnosis not present

## 2024-04-19 DIAGNOSIS — F411 Generalized anxiety disorder: Secondary | ICD-10-CM | POA: Diagnosis not present

## 2024-04-19 DIAGNOSIS — F330 Major depressive disorder, recurrent, mild: Secondary | ICD-10-CM | POA: Diagnosis not present

## 2024-04-25 DIAGNOSIS — N390 Urinary tract infection, site not specified: Secondary | ICD-10-CM | POA: Diagnosis not present

## 2024-04-25 DIAGNOSIS — N401 Enlarged prostate with lower urinary tract symptoms: Secondary | ICD-10-CM | POA: Diagnosis not present

## 2024-06-01 DIAGNOSIS — R972 Elevated prostate specific antigen [PSA]: Secondary | ICD-10-CM | POA: Diagnosis not present

## 2024-06-08 DIAGNOSIS — G479 Sleep disorder, unspecified: Secondary | ICD-10-CM | POA: Diagnosis not present

## 2024-06-08 DIAGNOSIS — N401 Enlarged prostate with lower urinary tract symptoms: Secondary | ICD-10-CM | POA: Diagnosis not present

## 2024-06-08 DIAGNOSIS — R351 Nocturia: Secondary | ICD-10-CM | POA: Diagnosis not present

## 2024-06-11 DIAGNOSIS — R351 Nocturia: Secondary | ICD-10-CM | POA: Diagnosis not present

## 2024-06-11 DIAGNOSIS — R972 Elevated prostate specific antigen [PSA]: Secondary | ICD-10-CM | POA: Diagnosis not present

## 2024-06-11 DIAGNOSIS — N401 Enlarged prostate with lower urinary tract symptoms: Secondary | ICD-10-CM | POA: Diagnosis not present

## 2024-06-14 ENCOUNTER — Other Ambulatory Visit: Payer: Self-pay | Admitting: Urology

## 2024-06-14 DIAGNOSIS — N401 Enlarged prostate with lower urinary tract symptoms: Secondary | ICD-10-CM

## 2024-07-06 NOTE — Progress Notes (Signed)
 Chief Complaint: Patient was seen in consultation today for benign prostatic hyperplasia with lower urinary tract symptoms.   Referring Physician(s): Herrick,Benjamin W  History of Present Illness: Steven Lutz is a 63 y.o. male with a medical history significant for benign prostatic hyperplasia with lower urinary tract symptoms. He has a history of elevated PSA. He is followed by [facility] but was seen urgently at his PCPs office 06/08/24 for worsening nocturia. The patient complained of having to urinate as often as every 1-2 hours during the night. He has urgency during the daytime and sometimes has a hard time holding the urine. Sometimes he must strain to empty his bladder. He is currently taking Flomax and and he was advised to double his dose. He has never had an episode of acute urinary retention.  The patient is hoping to avoid prostate surgery and is willing to explore other options. He presents today to the IR outpatient clinic to discuss prostate artery embolization.      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  No past medical history on file.    Allergies: Patient has no allergy information on record.  Medications: Prior to Admission medications   Not on File     No family history on file.  Social History   Socioeconomic History   Marital status: Married    Spouse name: Not on file   Number of children: Not on file   Years of education: Not on file   Highest education level: Not on file  Occupational History   Not on file  Tobacco Use   Smoking status: Not on file   Smokeless tobacco: Not on file  Substance and Sexual Activity   Alcohol use: Not on file   Drug use: Not on file   Sexual activity: Not on file  Other Topics Concern   Not on file  Social History Narrative   Not on file   Social Drivers of Health   Financial Resource Strain: Not on file  Food Insecurity:  Not on file  Transportation Needs: Not on file  Physical Activity: Not on file  Stress: Not on file  Social Connections: Not on file      Review of Systems: A 12 point ROS discussed and pertinent positives are indicated in the HPI above.  All other systems are negative.  Vital Signs: There were no vitals taken for this visit.  Physical Exam Constitutional:      General: He is not in acute distress. HENT:     Head: Normocephalic.     Mouth/Throat:     Mouth: Mucous membranes are moist.  Eyes:     General: No scleral icterus. Cardiovascular:     Rate and Rhythm: Normal rate and regular rhythm.  Pulmonary:     Effort: No respiratory distress.  Abdominal:     General: There is no distension.  Musculoskeletal:     Right lower leg: No edema.     Left lower leg: No edema.  Skin:    General: Skin is warm and dry.  Neurological:     Mental Status: He is alert and oriented to person, place, and time.     Imaging: MR Prostate (10/02/20)  ~49 g   Labs:  CBC: No results for input(s): WBC, HGB, HCT, PLT in the last 8760 hours.  COAGS: No results for input(s): INR, APTT in the last 8760 hours.  BMP: No results for input(s): NA, K, CL, CO2, GLUCOSE, BUN, CALCIUM, CREATININE, GFRNONAA, GFRAA in the last 8760 hours.  Invalid input(s): CMP  LIVER FUNCTION TESTS: No results for input(s): BILITOT, AST, ALT, ALKPHOS, PROT, ALBUMIN in the last 8760 hours.  TUMOR MARKERS: No results for input(s): AFPTM, CEA, CA199, CHROMGRNA in the last 8760 hours.  Assessment and Plan: 63 year old male with a history of benign prostatic hyperplasia (~49 g) with severe lower urinary tract symptom (IPSS-QoL 26-5).  His primary complaint is nocturia which is detrimental to his sleep.  He is very interested in minimally invasive procedure.  He would be an excellent candidate for prostate artery embolization.  We discussed in depth the  rationale, periprocedural expectations, and long term expected outcomes of prostate artery embolization.   He would like to proceed.  -obtain CTA pelvis prior to procedure -plan for prostate artery embolization with moderate sedation via left radial artery access at [facility]    Ester Sides, MD Pager: [phone]    I spent a total of  40 Minutes   in face to face in clinical consultation, greater than 50% of which was counseling/coordinating care for benign prostatic hyperplasia

## 2024-07-09 ENCOUNTER — Ambulatory Visit
Admission: RE | Admit: 2024-07-09 | Discharge: 2024-07-09 | Disposition: A | Discharge status: Home / Self Care | Source: Ambulatory Visit | Attending: Urology | Admitting: Urology

## 2024-07-09 DIAGNOSIS — N401 Enlarged prostate with lower urinary tract symptoms: Secondary | ICD-10-CM

## 2024-07-10 ENCOUNTER — Other Ambulatory Visit: Payer: Self-pay | Admitting: Interventional Radiology

## 2024-07-10 DIAGNOSIS — N401 Enlarged prostate with lower urinary tract symptoms: Secondary | ICD-10-CM

## 2024-07-11 ENCOUNTER — Encounter: Payer: Self-pay | Admitting: Radiology

## 2024-07-11 ENCOUNTER — Other Ambulatory Visit (HOSPITAL_COMMUNITY): Payer: Self-pay | Admitting: Interventional Radiology

## 2024-07-11 ENCOUNTER — Ambulatory Visit
Admission: RE | Admit: 2024-07-11 | Discharge: 2024-07-11 | Disposition: A | Discharge status: Home / Self Care | Source: Ambulatory Visit | Attending: Interventional Radiology | Admitting: Interventional Radiology

## 2024-07-11 DIAGNOSIS — N401 Enlarged prostate with lower urinary tract symptoms: Secondary | ICD-10-CM | POA: Diagnosis not present

## 2024-07-11 DIAGNOSIS — N138 Other obstructive and reflux uropathy: Secondary | ICD-10-CM

## 2024-07-11 MED ORDER — IOPAMIDOL (ISOVUE-370) INJECTION 76%
100.0000 mL | Freq: Once | INTRAVENOUS | Status: AC | PRN
  Administered 2024-07-11: 100 mL via INTRAVENOUS

## 2024-08-06 ENCOUNTER — Other Ambulatory Visit (HOSPITAL_COMMUNITY): Payer: Self-pay | Admitting: Student

## 2024-08-06 ENCOUNTER — Telehealth (HOSPITAL_COMMUNITY): Payer: Self-pay | Admitting: Student

## 2024-08-06 DIAGNOSIS — N401 Enlarged prostate with lower urinary tract symptoms: Secondary | ICD-10-CM

## 2024-08-06 MED ORDER — SOLIFENACIN SUCCINATE 5 MG PO TABS: 5.0000 mg | ORAL_TABLET | Freq: Every day | ORAL | 0 refills | Status: AC

## 2024-08-06 MED ORDER — PHENAZOPYRIDINE HCL 100 MG PO TABS: 100.0000 mg | ORAL_TABLET | Freq: Three times a day (TID) | ORAL | 0 refills | Status: AC

## 2024-08-06 MED ORDER — CIPROFLOXACIN HCL 500 MG PO TABS: 500.0000 mg | ORAL_TABLET | Freq: Two times a day (BID) | ORAL | 0 refills | Status: AC

## 2024-08-06 MED ORDER — METHYLPREDNISOLONE 4 MG PO TBPK: ORAL_TABLET | ORAL | 0 refills | Status: AC

## 2024-08-06 NOTE — Telephone Encounter (Signed)
 Patient is scheduled for prostate artery embolization with Dr. Jennefer 08/09/24. Post-procedure medications have been e-prescribed to the [facility] in [place]. These medications were reviewed with the patient (vesicare, pyridium, cipro, medrol dose pak). Patient is aware of arrival time to [facility] on 10/9 and he knows he can call the clinic with any questions/concerns prior to his procedure.   Yohan Samons, AGACNP-BC 08/06/2024, 2:45 PM

## 2024-08-08 ENCOUNTER — Other Ambulatory Visit: Payer: Self-pay | Admitting: Radiology

## 2024-08-09 ENCOUNTER — Other Ambulatory Visit (HOSPITAL_COMMUNITY): Payer: Self-pay | Admitting: Interventional Radiology

## 2024-08-09 ENCOUNTER — Other Ambulatory Visit: Payer: Self-pay

## 2024-08-09 ENCOUNTER — Encounter (HOSPITAL_COMMUNITY): Payer: Self-pay

## 2024-08-09 ENCOUNTER — Ambulatory Visit (HOSPITAL_COMMUNITY)
Admission: RE | Admit: 2024-08-09 | Discharge: 2024-08-09 | Disposition: A | Discharge status: Home / Self Care | Source: Ambulatory Visit | Attending: Interventional Radiology | Admitting: Interventional Radiology

## 2024-08-09 DIAGNOSIS — N138 Other obstructive and reflux uropathy: Secondary | ICD-10-CM | POA: Diagnosis not present

## 2024-08-09 DIAGNOSIS — N401 Enlarged prostate with lower urinary tract symptoms: Secondary | ICD-10-CM | POA: Diagnosis not present

## 2024-08-09 HISTORY — PX: IR EMBO ARTERIAL NOT HEMORR HEMANG INC GUIDE ROADMAPPING: IMG5448

## 2024-08-09 HISTORY — PX: IR ANGIOGRAM SELECTIVE EACH ADDITIONAL VESSEL: IMG667

## 2024-08-09 HISTORY — PX: IR US GUIDE VASC ACCESS LEFT: IMG2389

## 2024-08-09 HISTORY — PX: IR ANGIOGRAM PELVIS SELECTIVE OR SUPRASELECTIVE: IMG661

## 2024-08-09 LAB — CBC
HCT: 43.8 % (ref 39.0–52.0)
Hemoglobin: 15 g/dL (ref 13.0–17.0)
MCH: 29.2 pg (ref 26.0–34.0)
MCHC: 34.2 g/dL (ref 30.0–36.0)
MCV: 85.4 fL (ref 80.0–100.0)
Platelets: 215 K/uL (ref 150–400)
RBC: 5.13 MIL/uL (ref 4.22–5.81)
RDW: 12.1 % (ref 11.5–15.5)
WBC: 4.1 K/uL (ref 4.0–10.5)
nRBC: 0 % (ref 0.0–0.2)

## 2024-08-09 LAB — BASIC METABOLIC PANEL WITH GFR
Anion gap: 9 (ref 5–15)
BUN: 24 mg/dL — ABNORMAL HIGH (ref 8–23)
CO2: 26 mmol/L (ref 22–32)
Calcium: 8.6 mg/dL — ABNORMAL LOW (ref 8.9–10.3)
Chloride: 102 mmol/L (ref 98–111)
Creatinine, Ser: 1.42 mg/dL — ABNORMAL HIGH (ref 0.61–1.24)
GFR, Estimated: 56 mL/min — ABNORMAL LOW (ref >60)
Glucose, Bld: 98 mg/dL (ref 70–99)
Potassium: 3.9 mmol/L (ref 3.5–5.1)
Sodium: 137 mmol/L (ref 135–145)

## 2024-08-09 LAB — PROTIME-INR
INR: 1.1 (ref 0.8–1.2)
Prothrombin Time: 14.8 s (ref 11.4–15.2)

## 2024-08-09 MED ORDER — MIDAZOLAM HCL 2 MG/2ML IJ SOLN
INTRAMUSCULAR | Status: AC
  Filled 2024-08-09: qty 2

## 2024-08-09 MED ORDER — SODIUM CHLORIDE 0.9 % IV SOLN: 8.0000 mg | Freq: Once | INTRAVENOUS | Status: DC

## 2024-08-09 MED ORDER — FENTANYL CITRATE (PF) 100 MCG/2ML IJ SOLN
INTRAMUSCULAR | Status: AC
  Filled 2024-08-09: qty 2

## 2024-08-09 MED ORDER — LIDOCAINE-EPINEPHRINE 1 %-1:100000 IJ SOLN
10.0000 mL | Freq: Once | INTRAMUSCULAR | Status: AC
  Administered 2024-08-09: 4 mL via INTRADERMAL

## 2024-08-09 MED ORDER — LIDOCAINE-PRILOCAINE 2.5-2.5 % EX CREA
TOPICAL_CREAM | Freq: Once | CUTANEOUS | Status: AC
  Filled 2024-08-09: qty 5

## 2024-08-09 MED ORDER — LIDOCAINE HCL URETHRAL/MUCOSAL 2 % EX GEL
1.0000 | Freq: Once | CUTANEOUS | Status: AC
  Administered 2024-08-09: 1 via URETHRAL
  Filled 2024-08-09: qty 6

## 2024-08-09 MED ORDER — VERAPAMIL HCL 2.5 MG/ML IV SOLN: INTRA_ARTERIAL | Status: AC | PRN

## 2024-08-09 MED ORDER — SODIUM CHLORIDE 0.9 % IV SOLN: INTRAVENOUS | Status: DC

## 2024-08-09 MED ORDER — NITROGLYCERIN 1 MG/10 ML FOR IR/CATH LAB
INTRA_ARTERIAL | Status: AC
  Filled 2024-08-09: qty 10

## 2024-08-09 MED ORDER — NITROGLYCERIN 1 MG/10 ML FOR IR/CATH LAB
INTRA_ARTERIAL | Status: AC | PRN
  Administered 2024-08-09 (×3): 200 ug

## 2024-08-09 MED ORDER — VERAPAMIL HCL 2.5 MG/ML IV SOLN
INTRAVENOUS | Status: AC
  Filled 2024-08-09: qty 2

## 2024-08-09 MED ORDER — FENTANYL CITRATE (PF) 100 MCG/2ML IJ SOLN
INTRAMUSCULAR | Status: AC | PRN
  Administered 2024-08-09: 50 ug via INTRAVENOUS
  Administered 2024-08-09 (×6): 25 ug via INTRAVENOUS

## 2024-08-09 MED ORDER — LIDOCAINE-EPINEPHRINE 1 %-1:100000 IJ SOLN
INTRAMUSCULAR | Status: AC
  Filled 2024-08-09: qty 1

## 2024-08-09 MED ORDER — IOHEXOL 300 MG/ML  SOLN
150.0000 mL | Freq: Once | INTRAMUSCULAR | Status: AC | PRN
  Administered 2024-08-09: 100 mL via INTRA_ARTERIAL

## 2024-08-09 MED ORDER — PREDNISONE 20 MG PO TABS
20.0000 mg | ORAL_TABLET | Freq: Once | ORAL | Status: AC
  Administered 2024-08-09: 20 mg via ORAL
  Filled 2024-08-09: qty 1

## 2024-08-09 MED ORDER — HEPARIN SODIUM (PORCINE) 1000 UNIT/ML IJ SOLN
INTRAMUSCULAR | Status: AC
  Filled 2024-08-09: qty 10

## 2024-08-09 MED ORDER — ONDANSETRON HCL 4 MG/2ML IJ SOLN
4.0000 mg | Freq: Once | INTRAMUSCULAR | Status: AC
  Administered 2024-08-09: 4 mg via INTRAVENOUS

## 2024-08-09 MED ORDER — ONDANSETRON HCL 4 MG/2ML IJ SOLN
INTRAMUSCULAR | Status: AC
  Filled 2024-08-09: qty 2

## 2024-08-09 MED ORDER — MIDAZOLAM HCL 2 MG/2ML IJ SOLN
INTRAMUSCULAR | Status: AC | PRN
  Administered 2024-08-09 (×3): .5 mg via INTRAVENOUS
  Administered 2024-08-09 (×2): 1 mg via INTRAVENOUS
  Administered 2024-08-09: .5 mg via INTRAVENOUS

## 2024-08-09 MED ORDER — CHLORHEXIDINE GLUCONATE CLOTH 2 % EX PADS: 6.0000 | MEDICATED_PAD | Freq: Every day | CUTANEOUS | Status: DC

## 2024-08-09 MED ORDER — IOHEXOL 300 MG/ML  SOLN
100.0000 mL | Freq: Once | INTRAMUSCULAR | Status: AC | PRN
  Administered 2024-08-09: 25 mL via INTRA_ARTERIAL

## 2024-08-09 MED ORDER — CIPROFLOXACIN IN D5W 400 MG/200ML IV SOLN
INTRAVENOUS | Status: AC | PRN
  Administered 2024-08-09: 400 mg via INTRAVENOUS

## 2024-08-09 MED ORDER — CIPROFLOXACIN IN D5W 400 MG/200ML IV SOLN
400.0000 mg | INTRAVENOUS | Status: DC
  Filled 2024-08-09: qty 200

## 2024-08-09 MED ORDER — NITROGLYCERIN 2 % TD OINT
1.0000 [in_us] | TOPICAL_OINTMENT | Freq: Once | TRANSDERMAL | Status: AC
  Administered 2024-08-09: 1 [in_us] via TOPICAL
  Filled 2024-08-09: qty 1

## 2024-08-09 NOTE — Procedures (Signed)
 Interventional Radiology Procedure Note  Procedure: Left prostate artery embolization   Findings: Please refer to procedural dictation for full description. No isolated right prostatic artery, multiple shared branches with bladder, rectum, and penis, unsafe for embolization.  Successful left prostatic artery embolization. Left radial artery access, TR band applied with 12 cc at 11:40.  Complications: None immediate  Estimated Blood Loss: < 5 ml  Recommendations: TR band release protocol. IR will arrange 1 month outpatient follow up.   Ester Sides, MD

## 2024-08-09 NOTE — Progress Notes (Signed)
 TR BAND REMOVAL  LOCATION:    left radial  DEFLATED PER PROTOCOL:    Yes.    TIME BAND OFF / DRESSING APPLIED: 08/09/24 at 1405   SITE UPON ARRIVAL:    Level 0  SITE AFTER BAND REMOVAL:    Level 0  CIRCULATION SENSATION AND MOVEMENT:    Within Normal Limits   Yes.    COMMENTS:

## 2024-08-09 NOTE — Consult Note (Signed)
 Chief Complaint: Patient was seen in consultation today for benign prostatic hypertrophy  Referring Physician(s): Dr. Morene Salines  Supervising Physician: Jennefer Rover  Patient Status: [facility] - Out-pt  History of Present Illness: Steven Lutz is a 63 y.o. male with no significant past medical history who has developed benign prostatic hypertrophy with lower urinary tract symptoms with an elevated PSA.  He was referred to Dr. Jennefer for prostate artery embolization at the request of Dr. Salines.  He has met with patient in consultation and discussed merits, risks, and benefits.  After discussion he elects to proceed with embolization and presents to [facility] today for same.    Steven Lutz presents in his usual state of health.  He has been NPO.  He does not take blood thinners.  His wife is available for post-procedure care and transportation.  Post-procedure medications have been called in and he has already picked them up from the pharmacy.  He is understanding of hte goals of the procedure today and is agreeable to proceed.   He is A FULL CODE.   History reviewed. No pertinent past medical history.  History reviewed. No pertinent surgical history.  Allergies: Tetracyclines & related  Medications: Prior to Admission medications   Medication Sig Start Date End Date Taking? Authorizing Provider  ciprofloxacin (CIPRO) 500 MG tablet Take 1 tablet (500 mg total) by mouth 2 (two) times daily for 7 days. 08/06/24 08/13/24  Covington, Jamie R, NP  methylPREDNISolone (MEDROL DOSEPAK) 4 MG TBPK tablet Dispense per instructions on box. 08/06/24   Covington, Jamie R, NP  phenazopyridine (PYRIDIUM) 100 MG tablet Take 1 tablet (100 mg total) by mouth in the morning, at noon, and at bedtime for 7 days. 08/06/24 08/13/24  Covington, Jamie R, NP  solifenacin (VESICARE) 5 MG tablet Take 1 tablet (5 mg total) by mouth daily for 7 days. 08/06/24 08/13/24  Tonette Warren SAUNDERS, NP     History  reviewed. No pertinent family history.  Social History   Socioeconomic History   Marital status: Married    Spouse name: Not on file   Number of children: Not on file   Years of education: Not on file   Highest education level: Not on file  Occupational History   Not on file  Tobacco Use   Smoking status: Not on file   Smokeless tobacco: Not on file  Substance and Sexual Activity   Alcohol use: Not on file   Drug use: Not on file   Sexual activity: Not on file  Other Topics Concern   Not on file  Social History Narrative   Not on file   Social Drivers of Health   Financial Resource Strain: Not on file  Food Insecurity: Not on file  Transportation Needs: Not on file  Physical Activity: Not on file  Stress: Not on file  Social Connections: Not on file     Review of Systems: A 12 point ROS discussed and pertinent positives are indicated in the HPI above.  All other systems are negative.  Review of Systems  Constitutional:  Negative for fatigue and fever.  Respiratory:  Negative for cough and shortness of breath.   Cardiovascular:  Negative for chest pain.  Gastrointestinal:  Negative for abdominal pain, nausea and vomiting.  Musculoskeletal:  Negative for back pain.  Psychiatric/Behavioral:  Negative for behavioral problems and confusion.     Vital Signs: BP (!) 137/96   Pulse 62   Temp 98.2 F (36.8 C) (Oral)  Resp 15   Ht 5' 9 (1.753 m)   Wt 175 lb (79.4 kg)   SpO2 98%   BMI 25.84 kg/m   Physical Exam Vitals and nursing note reviewed.  Constitutional:      General: He is not in acute distress.    Appearance: Normal appearance. He is not ill-appearing.  HENT:     Mouth/Throat:     Mouth: Mucous membranes are moist.     Pharynx: Oropharynx is clear.  Cardiovascular:     Rate and Rhythm: Normal rate and regular rhythm.     Pulses: Normal pulses.  Pulmonary:     Effort: Pulmonary effort is normal. No respiratory distress.  Abdominal:     General:  Abdomen is flat. There is no distension.     Palpations: Abdomen is soft.  Skin:    General: Skin is warm and dry.  Neurological:     General: No focal deficit present.     Mental Status: He is alert and oriented to person, place, and time. Mental status is at baseline.  Psychiatric:        Mood and Affect: Mood normal.        Behavior: Behavior normal.        Thought Content: Thought content normal.        Judgment: Judgment normal.      MD Evaluation Airway: WNL Heart: WNL Abdomen: WNL Chest/ Lungs: WNL ASA  Classification: 3 Mallampati/Airway Score: Two   Imaging: CT ANGIO PELVIS W OR WO CONTRAST Result Date: 07/17/2024 CLINICAL DATA:  Prostatomegaly with lower urinary tract symptoms. Preoperative planning prior to prostate artery embolization. EXAM: CT ANGIOGRAPHY OF PELVIS TECHNIQUE: Multidetector CT imaging of the abdomen and pelvis was performed using the standard protocol during bolus administration of intravenous contrast. Multiplanar reconstructed images and MIPs were obtained and reviewed to evaluate the vascular anatomy. RADIATION DOSE REDUCTION: This exam was performed according to the departmental dose-optimization program which includes automated exposure control, adjustment of the mA and/or kV according to patient size and/or use of iterative reconstruction technique. CONTRAST:  100mL ISOVUE -370 IOPAMIDOL  (ISOVUE -370) INJECTION 76% COMPARISON:  None Available. FINDINGS: VASCULAR IMA: Visualized portions of the IMA are unremarkable. Inflow: Mild tortuosity with trace calcified atherosclerotic plaque. No significant stenosis, occlusion, dissection or aneurysm. 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 No evidence of prostatic contribution. Interestingly, the left inferior epigastric artery arises relatively proximally from the left external iliac artery. The right prostatic artery is well visualized and arises proximally from the anterior  division likely from a short common trunk with the internal pudendal artery. The left prostatic artery is also well visualized and arises proximally from the internal pudendal artery. Proximal Outflow: Bilateral common femoral and visualized portions of the superficial and profunda femoral arteries are patent without evidence of aneurysm, dissection, vasculitis or significant stenosis. Veins: No focal venous abnormality. Review of the MIP images confirms the above findings. NON-VASCULAR Adrenals/Urinary Tract: Lower pole of the right kidney is unremarkable. The ureters and bladder are unremarkable. Stomach/Bowel: No focal bowel wall thickening or evidence of obstruction. Normal appendix. Lymphatic: No suspicious lymphadenopathy. Reproductive: Prostatomegaly. The prostate gland measures 5.4 x 6.2 x 5.0 cm (88 cc). Other: No abdominal wall hernia or abnormality. No abdominopelvic ascites. Musculoskeletal: No acute or significant osseous findings. IMPRESSION: VASCULAR 1. Well visualized right prostatic artery arising proximally from the anterior division likely immediately off the origin of the internal pudendal artery. 2. Well visualized left prostatic artery arising proximally  from the internal pudendal artery. 3. 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 NON-VASCULAR 1. Prostatomegaly (88 g). Electronically Signed   By: Wilkie Lent M.D.   On: 07/17/2024 15:32    Labs:  CBC: No results for input(s): WBC, HGB, HCT, PLT in the last 8760 hours.  COAGS: No results for input(s): INR, APTT in the last 8760 hours.  BMP: No results for input(s): NA, K, CL, CO2, GLUCOSE, BUN, CALCIUM, CREATININE, GFRNONAA, GFRAA in the last 8760 hours.  Invalid input(s): CMP  LIVER FUNCTION TESTS: No results for input(s): BILITOT, AST, ALT, ALKPHOS, PROT, ALBUMIN in the last 8760 hours.  TUMOR MARKERS: No results for input(s): AFPTM,  CEA, CA199, CHROMGRNA in the last 8760 hours.  Assessment and Plan: Patient with past medical history of benign prostatic hypertrophy presents with complaint of nocturnal uria.  IR consulted for prostate artery embolization at the request of Dr. Cam. Case reviewed by Dr. Jennefer who has met with the patient in consultation and who approves patient for procedure.  Patient presents today in their usual state of health.  He has been NPO and is not currently on blood thinners.   The Risks and benefits of embolization were discussed with the patient including, but not limited to bleeding, infection, vascular injury, post operative pain, or contrast induced renal failure.  This procedure involves the use of X-rays and because of the nature of the planned procedure, it is possible that we will have prolonged use of X-ray fluoroscopy.  Potential radiation risks to you include (but are not limited to) the following: - A slightly elevated risk for cancer several years later in life. This risk is typically less than 0.5% percent. This risk is low in comparison to the normal incidence of human cancer, which is 33% for women and 50% for men according to the [employer] Society. - Radiation induced injury can include skin redness, resembling a rash, tissue breakdown / ulcers and hair loss (which can be temporary or permanent).   The likelihood of either of these occurring depends on the difficulty of the procedure and whether you are sensitive to radiation due to previous procedures, disease, or genetic conditions.   IF your procedure requires a prolonged use of radiation, you will be notified and given written instructions for further action.  It is your responsibility to monitor the irradiated area for the 2 weeks following the procedure and to notify your physician if you are concerned that you have suffered a radiation induced injury.    All of the patient's questions were answered,  patient is agreeable to proceed. Consent signed and in chart.   Thank you for this interesting consult.  I greatly enjoyed meeting NALU TROUBLEFIELD and look forward to participating in their care.  A copy of this report was sent to the requesting provider on this date.  Electronically Signed: Marshon Bangs Sue-Ellen Tianah Lonardo, PA 08/09/2024, 8:34 AM   I spent a total of    25 Minutes in face to face in clinical consultation, greater than 50% of which was counseling/coordinating care for benign prostatic hypertrophy.

## 2024-08-21 DIAGNOSIS — Z111 Encounter for screening for respiratory tuberculosis: Secondary | ICD-10-CM | POA: Diagnosis not present

## 2024-08-21 DIAGNOSIS — Z7189 Other specified counseling: Secondary | ICD-10-CM | POA: Diagnosis not present

## 2024-08-22 ENCOUNTER — Other Ambulatory Visit: Payer: Self-pay | Admitting: Interventional Radiology

## 2024-08-22 DIAGNOSIS — N401 Enlarged prostate with lower urinary tract symptoms: Secondary | ICD-10-CM

## 2024-08-27 DIAGNOSIS — R972 Elevated prostate specific antigen [PSA]: Secondary | ICD-10-CM | POA: Diagnosis not present

## 2024-08-27 DIAGNOSIS — R3912 Poor urinary stream: Secondary | ICD-10-CM | POA: Diagnosis not present

## 2024-09-10 ENCOUNTER — Ambulatory Visit
Admission: RE | Admit: 2024-09-10 | Discharge: 2024-09-10 | Disposition: A | Discharge status: Home / Self Care | Source: Ambulatory Visit | Attending: Interventional Radiology | Admitting: Interventional Radiology

## 2024-09-10 DIAGNOSIS — N401 Enlarged prostate with lower urinary tract symptoms: Secondary | ICD-10-CM | POA: Diagnosis not present

## 2024-09-10 HISTORY — PX: IR RADIOLOGIST EVAL & MGMT: IMG5224

## 2024-09-10 NOTE — Progress Notes (Signed)
 Referring Physician(s): Herrick,Benjamin W   Chief Complaint: The patient is seen in follow up today s/p prostate artery embolization 08/09/24  History of present illness: HPI from initial consultation 07/09/24 Steven Lutz is a 63 y.o. male with a medical history significant for benign prostatic hyperplasia with lower urinary tract symptoms. He has a history of elevated PSA. He is followed by [facility] but was seen urgently at his PCPs office 06/08/24 for worsening nocturia. The patient complained of having to urinate as often as every 1-2 hours during the night. He has urgency during the daytime and sometimes has a hard time holding the urine. Sometimes he must strain to empty his bladder. He is currently taking Flomax and and he was advised to double his dose. He has never had an episode of acute urinary retention.   The patient was interested in a minimally invasive procedure and we discussed the rationale, periprocedural expectations, and long term expected outcomes of prostate artery embolization.  On 08/09/24 he underwent a technically successful left prostate artery embolization. The right prostate artery was not embolized due to no distinct visualization of an isolated right prostatic artery due to shared contribution from medial vascular beds including vesicular and inferior rectal.  He tolerated the procedure well and he presents to the IR clinic today for follow up.  He is very pleased with the results.  Within the first week his urinary urgency essentially resolved.  His nocturia has decreased from every hour to 1-2x per night.  His strain has improved significantly.  He has experienced improved sexual function.    No past medical history on file.  Past Surgical History:  Procedure Laterality Date   IR ANGIOGRAM PELVIS SELECTIVE OR SUPRASELECTIVE  08/09/2024   IR ANGIOGRAM SELECTIVE EACH ADDITIONAL VESSEL  08/09/2024   IR ANGIOGRAM SELECTIVE EACH ADDITIONAL VESSEL  08/09/2024    IR EMBO ARTERIAL NOT HEMORR HEMANG INC GUIDE ROADMAPPING  08/09/2024   IR US  GUIDE VASC ACCESS LEFT  08/09/2024   IR US  GUIDE VASC ACCESS LEFT  08/09/2024   IR US  GUIDE VASC ACCESS LEFT  08/09/2024    Allergies: Tetracyclines & related  Medications: Prior to Admission medications   Medication Sig Start Date End Date Taking? Authorizing Provider  methylPREDNISolone (MEDROL DOSEPAK) 4 MG TBPK tablet Dispense per instructions on box. 08/06/24   Tonette Warren SAUNDERS, NP     No family history on file.  Social History   Socioeconomic History   Marital status: Married    Spouse name: Not on file   Number of children: Not on file   Years of education: Not on file   Highest education level: Not on file  Occupational History   Not on file  Tobacco Use   Smoking status: Not on file   Smokeless tobacco: Not on file  Substance and Sexual Activity   Alcohol use: Not on file   Drug use: Not on file   Sexual activity: Not on file  Other Topics Concern   Not on file  Social History Narrative   Not on file   Social Drivers of Health   Financial Resource Strain: Not on file  Food Insecurity: Not on file  Transportation Needs: Not on file  Physical Activity: Not on file  Stress: Not on file  Social Connections: Not on file     Vital Signs: There were no vitals taken for this visit.  Physical Exam Constitutional:      General: He is not in  acute distress. HENT:     Head: Normocephalic.  Eyes:     General: No scleral icterus. Cardiovascular:     Rate and Rhythm: Normal rate and regular rhythm.  Pulmonary:     Effort: No respiratory distress.  Abdominal:     General: There is no distension.  Musculoskeletal:     Right lower leg: No edema.     Left lower leg: No edema.  Skin:    General: Skin is warm and dry.  Neurological:     Mental Status: He is alert and oriented to person, place, and time.     Imaging:  MR Prostate (10/02/20)  ~49 g  PAE 08/09/24   Right  prostate artery area, diffuse, irregular vessels without direct targeted supply to prostate.   Normal left prostate artery.  Labs:  CBC: Recent Labs    08/09/24 0816  WBC 4.1  HGB 15.0  HCT 43.8  PLT 215    COAGS: Recent Labs    08/09/24 0816  INR 1.1    BMP: Recent Labs    08/09/24 0816  NA 137  K 3.9  CL 102  CO2 26  GLUCOSE 98  BUN 24*  CALCIUM 8.6*  CREATININE 1.42*  GFRNONAA 56*    LIVER FUNCTION TESTS: No results for input(s): BILITOT, AST, ALT, ALKPHOS, PROT, ALBUMIN in the last 8760 hours.  Assessment and Plan: 62 year old male with a history of benign prostatic hyperplasia (~88 g) with severe lower urinary tract symptom (IPSS-QoL 26-5).  His primary complaint is nocturia which is detrimental to his sleep. He underwent a technically successful left prostate artery embolization 08/09/24.  IPSS has improved to 11-1 and he is very pleased with the results.  [place], we will not schedule follow up.  [place] will be [place] to [place] and will let us  know if he'd like to follow up further.  Ester Sides, MD Pager: [phone]    I spent a total of 25 Minutes in face to face in clinical consultation, greater than 50% of which was counseling/coordinating care for benign prostatic hyperplasia

## 2024-11-19 NOTE — Addendum Note (Signed)
 Encounter addended by: Janice Lynwood BROCKS on: 11/19/2024 9:26 AM  Actions taken: Imaging Exam ended
